# Patient Record
Sex: Male | Born: 1990 | Race: Black or African American | Hispanic: No | Marital: Single | Smoking: Former smoker
Health system: Southern US, Community
[De-identification: ages and names within clinical notes are randomized; demographics above are authoritative.]

---
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
## 2009-01-21 ENCOUNTER — Emergency Department (HOSPITAL_COMMUNITY): Admission: EM | Admit: 2009-01-21 | Discharge: 2009-01-21 | Payer: Self-pay | Admitting: Emergency Medicine

## 2009-02-19 ENCOUNTER — Emergency Department (HOSPITAL_COMMUNITY): Admission: EM | Admit: 2009-02-19 | Discharge: 2009-02-19 | Payer: Self-pay | Admitting: Emergency Medicine

## 2009-05-06 ENCOUNTER — Emergency Department (HOSPITAL_COMMUNITY): Admission: EM | Admit: 2009-05-06 | Discharge: 2009-05-06 | Payer: Self-pay | Admitting: Emergency Medicine

## 2009-07-25 ENCOUNTER — Emergency Department (HOSPITAL_COMMUNITY): Admission: EM | Admit: 2009-07-25 | Discharge: 2009-07-25 | Payer: Self-pay | Admitting: Emergency Medicine

## 2009-11-24 ENCOUNTER — Emergency Department (HOSPITAL_COMMUNITY): Admission: EM | Admit: 2009-11-24 | Discharge: 2009-11-24 | Payer: Self-pay | Admitting: Emergency Medicine

## 2009-11-29 ENCOUNTER — Emergency Department (HOSPITAL_COMMUNITY): Admission: EM | Admit: 2009-11-29 | Discharge: 2009-11-29 | Payer: Self-pay | Admitting: Emergency Medicine

## 2010-03-30 ENCOUNTER — Emergency Department (HOSPITAL_COMMUNITY)
Admission: EM | Admit: 2010-03-30 | Discharge: 2010-03-30 | Payer: Self-pay | Source: Home / Self Care | Admitting: Emergency Medicine

## 2010-04-03 LAB — GC/CHLAMYDIA PROBE AMP, GENITAL: Chlamydia, DNA Probe: NEGATIVE

## 2010-06-03 ENCOUNTER — Emergency Department (HOSPITAL_COMMUNITY)
Admission: EM | Admit: 2010-06-03 | Discharge: 2010-06-03 | Discharge status: Against Medical Advice | Payer: No Typology Code available for payment source | Attending: Emergency Medicine | Admitting: Emergency Medicine

## 2010-06-04 ENCOUNTER — Emergency Department (HOSPITAL_COMMUNITY)
Admission: EM | Admit: 2010-06-04 | Discharge: 2010-06-05 | Disposition: A | Discharge status: Home / Self Care | Payer: No Typology Code available for payment source | Attending: Emergency Medicine | Admitting: Emergency Medicine

## 2010-06-04 ENCOUNTER — Emergency Department (HOSPITAL_COMMUNITY): Payer: No Typology Code available for payment source

## 2010-06-04 DIAGNOSIS — M25476 Effusion, unspecified foot: Secondary | ICD-10-CM | POA: Insufficient documentation

## 2010-06-04 DIAGNOSIS — S93409A Sprain of unspecified ligament of unspecified ankle, initial encounter: Secondary | ICD-10-CM | POA: Insufficient documentation

## 2010-06-04 DIAGNOSIS — M25579 Pain in unspecified ankle and joints of unspecified foot: Secondary | ICD-10-CM | POA: Insufficient documentation

## 2010-06-04 DIAGNOSIS — M25473 Effusion, unspecified ankle: Secondary | ICD-10-CM | POA: Insufficient documentation

## 2010-06-12 LAB — RAPID STREP SCREEN (MED CTR MEBANE ONLY): Streptococcus, Group A Screen (Direct): NEGATIVE

## 2011-11-15 ENCOUNTER — Emergency Department (HOSPITAL_COMMUNITY)
Admission: EM | Admit: 2011-11-15 | Discharge: 2011-11-15 | Disposition: A | Discharge status: Home / Self Care | Payer: Self-pay | Attending: Emergency Medicine | Admitting: Emergency Medicine

## 2011-11-15 ENCOUNTER — Emergency Department (HOSPITAL_COMMUNITY): Payer: Self-pay

## 2011-11-15 ENCOUNTER — Encounter (HOSPITAL_COMMUNITY): Payer: Self-pay | Admitting: *Deleted

## 2011-11-15 DIAGNOSIS — M79609 Pain in unspecified limb: Secondary | ICD-10-CM | POA: Insufficient documentation

## 2011-11-15 DIAGNOSIS — M79643 Pain in unspecified hand: Secondary | ICD-10-CM

## 2011-11-15 DIAGNOSIS — Z87891 Personal history of nicotine dependence: Secondary | ICD-10-CM | POA: Insufficient documentation

## 2011-11-15 NOTE — ED Provider Notes (Signed)
History     CSN: [id number]  Arrival date & time 11/15/11  2101   First MD Initiated Contact with Patient 11/15/11 2234      Chief Complaint  Patient presents with  . Hand Pain    (Consider location/radiation/quality/duration/timing/severity/associated sxs/prior treatment) HPI Comments: Patient fractured.  The fourth metatarsal more than a year ago.  It heals, poorly.  He is having chronic pain at the site is worsened when he makes a this  Patient is a 21 y.o. male presenting with hand pain. The history is provided by the patient.  Hand Pain This is a chronic problem. The current episode started more than 1 year ago. The problem occurs constantly. Pertinent negatives include no fever, joint swelling, numbness or weakness.    History reviewed. No pertinent past medical history.  History reviewed. No pertinent past surgical history.  No family history on file.  History  Substance Use Topics  . Smoking status: Former Smoker    Types: Cigarettes  . Smokeless tobacco: Not on file  . Alcohol Use: 4.2 oz/week    7 Glasses of wine per week      Review of Systems  Constitutional: Negative for fever.  Musculoskeletal: Negative for joint swelling.  Skin: Negative for wound.  Neurological: Negative for weakness and numbness.    Allergies  Review of patient's allergies indicates no known allergies.  Home Medications  No current outpatient prescriptions on file.  BP 126/86  Pulse 73  Temp 98.7 F (37.1 C) (Oral)  Resp 20  SpO2 100%  Physical Exam  Constitutional: He appears well-nourished.  HENT:  Head: Normocephalic.  Neck: Normal range of motion.  Cardiovascular: Normal rate.   Pulmonary/Chest: Effort normal.  Musculoskeletal: Normal range of motion.       Arms: Neurological: He is alert.  Skin: Skin is warm.    ED Course  Procedures (including critical care time)  Labs Reviewed - No data to display Dg Hand Complete Right  11/15/2011  *RADIOLOGY REPORT*   Clinical Data: Right hand pain over the small and ring fingers. Fracture 1 year ago.  RIGHT HAND - COMPLETE 3+ VIEW  Comparison: 07/25/2009  Findings: Old fracture deformity of the fourth metacarpal corresponding to acute fracture on the previous study.  No evidence of acute fracture or subluxation.  No focal bone lesion or bone destruction.  No abnormal periosteal reaction.  Bone cortex and trabecular architecture appear intact.  No radiopaque soft tissue foreign bodies.  IMPRESSION: Old fracture deformity of the fourth metacarpal.  No acute fractures identified.   Original Report Authenticated By: Marlon Pel, M.D.      1. Hand pain       MDM   Old fracture.  That healed, poorly refer to hand surgery for second opinion        Arman Filter, NP 11/15/11 2320  Arman Filter, NP 11/15/11 2320

## 2011-11-15 NOTE — ED Provider Notes (Signed)
Medical screening examination/treatment/procedure(s) were performed by non-physician practitioner and as supervising physician I was immediately available for consultation/collaboration.  Bary Limbach L Hydia Copelin, MD 11/15/11 2345 

## 2011-11-15 NOTE — ED Notes (Signed)
Pt c/o metatarsal fx that was tx here in teh ED over a year ago.  However, 3 days ago the same location of the injury began to hurt and swell.  Swelling noted.  Pt denies recent injury,

## 2015-07-28 ENCOUNTER — Encounter (HOSPITAL_COMMUNITY): Payer: Self-pay | Admitting: Emergency Medicine

## 2015-07-28 ENCOUNTER — Emergency Department (HOSPITAL_COMMUNITY)
Admission: EM | Admit: 2015-07-28 | Discharge: 2015-07-28 | Disposition: A | Discharge status: Home / Self Care | Payer: No Typology Code available for payment source | Attending: Emergency Medicine | Admitting: Emergency Medicine

## 2015-07-28 DIAGNOSIS — H9201 Otalgia, right ear: Secondary | ICD-10-CM | POA: Insufficient documentation

## 2015-07-28 DIAGNOSIS — J029 Acute pharyngitis, unspecified: Secondary | ICD-10-CM

## 2015-07-28 DIAGNOSIS — Z87891 Personal history of nicotine dependence: Secondary | ICD-10-CM | POA: Insufficient documentation

## 2015-07-28 LAB — RAPID STREP SCREEN (MED CTR MEBANE ONLY): STREPTOCOCCUS, GROUP A SCREEN (DIRECT): NEGATIVE

## 2015-07-28 MED ORDER — CHLORHEXIDINE GLUCONATE 0.12 % MT SOLN: 15.0000 mL | Freq: Two times a day (BID) | OROMUCOSAL | Status: AC

## 2015-07-28 NOTE — ED Notes (Signed)
Pt. reports sore throat for several weeks with occasional dry cough , denies fever or chills. Respirations unlabored / no oral swelling.

## 2015-07-28 NOTE — Discharge Instructions (Signed)

## 2015-07-28 NOTE — ED Notes (Signed)
Patient able to ambulate independently  

## 2015-07-28 NOTE — ED Provider Notes (Signed)
CSN: [id number]     Arrival date & time 07/28/15  [id number] History  By signing my name below, I, [facility] Washington, attest that this documentation has been prepared under the direction and in the presence of Fayrene HelperBowie Amity Roes, PA-C. Electronically Signed: Randell PatientMarrissa Washington, ED Scribe. 07/28/2015. 8:23 PM.   Chief Complaint  Patient presents with  . Sore Throat   The history is provided by the patient. No language interpreter was used.   HPI Comments: Adrian Knox is a 25 y.o. male who presents to the Emergency Department complaining of constant, mild, right-sided sore throat onset 4 weeks ago. Pt states that the pain began on both sides of throat but has since progressed to only the right side. He reports congestion, rhinorrhea once 2 weeks ago, right ear pain, and cough productive of thick yellow-brown mucus last week. Symptoms are worsen when lying on his right side. He has taken cough drops, OTC medications, and used a humidifier with slight relief. He is an occasional cigarette smoker but only smoked once in the past 2 weeks. He drinks ETOH regularly. Denies HA, fever, CP, difficulty breathing.  History reviewed. No pertinent past medical history. History reviewed. No pertinent past surgical history. No family history on file. Social History  Substance Use Topics  . Smoking status: Former Smoker    Types: Cigarettes  . Smokeless tobacco: None  . Alcohol Use: Yes    Review of Systems  Constitutional: Negative for fever.  HENT: Positive for congestion, ear pain, rhinorrhea and sore throat.   Respiratory: Positive for cough.   Cardiovascular: Negative for chest pain.  Neurological: Negative for headaches.    Allergies  Review of patient's allergies indicates no known allergies.  Home Medications   Prior to Admission medications   Medication Sig Start Date End Date Taking? Authorizing Provider  chlorhexidine (PERIDEX) 0.12 % solution Use as directed 15 mLs in the mouth or throat 2  (two) times daily. 07/28/15   Fayrene HelperBowie Bevan Disney, PA-C   BP 135/56 mmHg  Pulse 71  Temp(Src) 99 F (37.2 C) (Oral)  Resp 18  Ht 5\' 11"  (1.803 m)  Wt 171 lb (77.565 kg)  BMI 23.86 kg/m2  SpO2 99% Physical Exam  Constitutional: He is oriented to person, place, and time. He appears well-developed and well-nourished. No distress.  HENT:  Head: Normocephalic and atraumatic.  Right Ear: Tympanic membrane, external ear and ear canal normal.  Left Ear: Tympanic membrane, external ear and ear canal normal.  Nose: Nose normal.  Mouth/Throat: Uvula is midline. Posterior oropharyngeal erythema present. No oropharyngeal exudate.  Ears normal. Nose normal. Throat and uvula midline. Mild posterior oropharynx erythema. No exudate.  Eyes: Conjunctivae are normal.  Neck: Normal range of motion.  No cervical lymphandeopathy.  Cardiovascular: Normal rate, regular rhythm and normal heart sounds.  Exam reveals no gallop and no friction rub.   No murmur heard. Heart is normal  Pulmonary/Chest: Effort normal and breath sounds normal. No respiratory distress. He has no wheezes. He has no rales.  Lungs CTA bilaterally.  Musculoskeletal: Normal range of motion.  Lymphadenopathy:    He has no cervical adenopathy.  Neurological: He is alert and oriented to person, place, and time.  Skin: Skin is warm and dry.  Psychiatric: He has a normal mood and affect. His behavior is normal.  Nursing note and vitals reviewed.   ED Course  Procedures   DIAGNOSTIC STUDIES: Oxygen Saturation is 99% on RA, normal by my interpretation.    COORDINATION OF  CARE: 8:09 PM Discussed results of rapid strep screen. Will discharge home with prescription for throat spray. Discussed treatment plan with pt at bedside and pt agreed to plan.  Labs Review Labs Reviewed  RAPID STREP SCREEN (NOT AT [facility])  CULTURE, GROUP A STREP [facility])   I have personally reviewed and evaluated these lab results as part of my medical  decision-making.    MDM   Final diagnoses:  Sore throat    BP 135/56 mmHg  Pulse 71  Temp(Src) 99 F (37.2 C) (Oral)  Resp 18  Ht  (1.803 m)  Wt 77.565 kg  BMI 23.86 kg/m2  SpO2 99%   I personally performed the services described in this documentation, which was scribed in my presence. The recorded information has been reviewed and is accurate.     Fayrene Helper, PA-C 07/28/15 2026  Donnetta Hutching, MD 07/28/15 [phone]

## 2015-07-31 LAB — CULTURE, GROUP A STREP (THRC)

## 2015-09-15 ENCOUNTER — Emergency Department (HOSPITAL_COMMUNITY)
Admission: EM | Admit: 2015-09-15 | Discharge: 2015-09-15 | Disposition: A | Discharge status: Home / Self Care | Payer: No Typology Code available for payment source | Attending: Emergency Medicine | Admitting: Emergency Medicine

## 2015-09-15 ENCOUNTER — Encounter (HOSPITAL_COMMUNITY): Payer: Self-pay | Admitting: Emergency Medicine

## 2015-09-15 DIAGNOSIS — K625 Hemorrhage of anus and rectum: Secondary | ICD-10-CM | POA: Insufficient documentation

## 2015-09-15 DIAGNOSIS — Z87891 Personal history of nicotine dependence: Secondary | ICD-10-CM | POA: Insufficient documentation

## 2015-09-15 LAB — URINALYSIS, ROUTINE W REFLEX MICROSCOPIC
Bilirubin Urine: NEGATIVE
Glucose, UA: NEGATIVE mg/dL
HGB URINE DIPSTICK: NEGATIVE
Ketones, ur: NEGATIVE mg/dL
Leukocytes, UA: NEGATIVE
NITRITE: NEGATIVE
PH: 7 (ref 5.0–8.0)
Protein, ur: NEGATIVE mg/dL
SPECIFIC GRAVITY, URINE: 1.03 (ref 1.005–1.030)

## 2015-09-15 LAB — COMPREHENSIVE METABOLIC PANEL
ALBUMIN: 4 g/dL (ref 3.5–5.0)
ALK PHOS: 56 U/L (ref 38–126)
ALT: 11 U/L — ABNORMAL LOW (ref 17–63)
ANION GAP: 6 (ref 5–15)
AST: 19 U/L (ref 15–41)
BUN: 14 mg/dL (ref 6–20)
CALCIUM: 9.5 mg/dL (ref 8.9–10.3)
CO2: 27 mmol/L (ref 22–32)
Chloride: 104 mmol/L (ref 101–111)
Creatinine, Ser: 1.35 mg/dL — ABNORMAL HIGH (ref 0.61–1.24)
GFR calc non Af Amer: >60 mL/min (ref >60)
GLUCOSE: 83 mg/dL (ref 65–99)
POTASSIUM: 3.9 mmol/L (ref 3.5–5.1)
SODIUM: 137 mmol/L (ref 135–145)
Total Bilirubin: 1 mg/dL (ref 0.3–1.2)
Total Protein: 6.8 g/dL (ref 6.5–8.1)

## 2015-09-15 LAB — LIPASE, BLOOD: LIPASE: 17 U/L (ref 11–51)

## 2015-09-15 LAB — CBC
HEMATOCRIT: 44.8 % (ref 39.0–52.0)
HEMOGLOBIN: 14.8 g/dL (ref 13.0–17.0)
MCH: 30 pg (ref 26.0–34.0)
MCHC: 33 g/dL (ref 30.0–36.0)
MCV: 90.7 fL (ref 78.0–100.0)
Platelets: 187 10*3/uL (ref 150–400)
RBC: 4.94 MIL/uL (ref 4.22–5.81)
RDW: 12.8 % (ref 11.5–15.5)
WBC: 5.3 10*3/uL (ref 4.0–10.5)

## 2015-09-15 LAB — POC OCCULT BLOOD, ED: FECAL OCCULT BLD: NEGATIVE

## 2015-09-15 MED ORDER — HYDROCORTISONE 2.5 % RE CREA: TOPICAL_CREAM | RECTAL | Status: AC

## 2015-09-15 NOTE — ED Notes (Signed)
Patient able to ambulate independently  

## 2015-09-15 NOTE — ED Provider Notes (Signed)
CSN: [id number]     Arrival date & time 09/15/15  1749 History   First MD Initiated Contact with Patient 09/15/15 2124     Chief Complaint  Patient presents with  . Abdominal Pain  . Rectal Bleeding     (Consider location/radiation/quality/duration/timing/severity/associated sxs/prior Treatment) Patient is a 25 y.o. male presenting with hematochezia. The history is provided by the patient.  Rectal Bleeding Quality:  Bright red Amount:  Scant Duration:  1 day Timing:  Intermittent Progression:  Resolved Chronicity:  New Context: diarrhea   Context: not anal fissures, not anal penetration, not constipation, not foreign body, not hemorrhoids, not rectal injury, not rectal pain and not spontaneously   Similar prior episodes: no   Relieved by:  None tried Worsened by:  Nothing tried Ineffective treatments:  None tried Associated symptoms: abdominal pain   Associated symptoms: no dizziness, no fever, no light-headedness and no vomiting     History reviewed. No pertinent past medical history. History reviewed. No pertinent past surgical history. No family history on file. Social History  Substance Use Topics  . Smoking status: Former Smoker    Types: Cigarettes  . Smokeless tobacco: None  . Alcohol Use: Yes    Review of Systems  Constitutional: Negative for fever and chills.  HENT: Negative for congestion and sore throat.   Eyes: Negative for pain.  Respiratory: Negative for cough and shortness of breath.   Cardiovascular: Negative for chest pain and palpitations.  Gastrointestinal: Positive for abdominal pain, diarrhea, hematochezia and anal bleeding. Negative for nausea, vomiting, constipation and abdominal distention.  Endocrine: Negative.   Genitourinary: Negative for dysuria, hematuria, flank pain, penile swelling and penile pain.  Musculoskeletal: Negative for back pain and neck pain.  Skin: Negative for rash.  Allergic/Immunologic: Negative.   Neurological: Negative  for dizziness, syncope and light-headedness.  Psychiatric/Behavioral: Negative for confusion.      Allergies  Review of patient's allergies indicates no known allergies.  Home Medications   Prior to Admission medications   Medication Sig Start Date End Date Taking? Authorizing Provider  chlorhexidine (PERIDEX) 0.12 % solution Use as directed 15 mLs in the mouth or throat 2 (two) times daily. 07/28/15   Fayrene HelperBowie Tran, PA-C   BP 123/67 mmHg  Pulse 81  Temp(Src) 98.6 F (37 C) (Oral)  Resp 18  SpO2 97% Physical Exam  Constitutional: He is oriented to person, place, and time. He appears well-developed and well-nourished.  HENT:  Head: Normocephalic and atraumatic.  Eyes: Conjunctivae and EOM are normal. Pupils are equal, round, and reactive to light.  Neck: Normal range of motion. Neck supple.  Cardiovascular: Normal rate, regular rhythm, normal heart sounds and intact distal pulses.   Pulmonary/Chest: Effort normal and breath sounds normal. No respiratory distress.  Abdominal: Soft. Bowel sounds are normal. There is no tenderness. There is no rebound, no CVA tenderness, no tenderness at McBurney's point and negative Murphy's sign. Hernia confirmed negative in the right inguinal area and confirmed negative in the left inguinal area.  Genitourinary: Rectum normal, testes normal and penis normal. Rectal exam shows no external hemorrhoid, no internal hemorrhoid, no fissure, no mass, no tenderness and anal tone normal. Guaiac negative stool. Cremasteric reflex is present. Circumcised.  Musculoskeletal: Normal range of motion.  Neurological: He is alert and oriented to person, place, and time. He has normal reflexes. No cranial nerve deficit.  Skin: Skin is warm and dry.    ED Course  Procedures (including critical care time) Labs Review Labs Reviewed  COMPREHENSIVE METABOLIC PANEL - Abnormal; Notable for the following:    Creatinine, Ser 1.35 (*)    ALT 11 (*)    All other components  within normal limits  URINALYSIS, ROUTINE W REFLEX MICROSCOPIC (NOT AT [facility]) - Abnormal; Notable for the following:    APPearance HAZY (*)    All other components within normal limits  LIPASE, BLOOD  CBC    Imaging Review No results found. I have personally reviewed and evaluated these images and lab results as part of my medical decision-making.   EKG Interpretation None      MDM   Final diagnoses:  None   The pt is a 25 yo male presenting to the ED for episode of finding blood in his underwear after two episodes of loose stools yesterday associated with mild abdominal pain.  Normal BM today with no continued diarrhea, n/v, fevers, or abdominal pain.  No dysuria, penile discharge or blood in urine.   Ddx hemorrhoids, UTI, nephrolithiasis, anal fissure.   UA negative for blood or infection with no hx of nephrolithiasis and no abdominal pain.  No hemorrhoids on exam or GU abnormalities.  Pt would like testing for STI but no profolactic abx at this time.  Labs ordered.  Hemoccult negative.  Doubt infectious diarrhea as sx have resolved. Unknown etiology at this time but doubt any acute infection or life threatening etiology.  Cr mildly elevated but doubt acute failure or RCC.   Rx for anusol cream for possible internal hemorrhoids and educated to f/u with PCP as needed.   Labs were viewed by myself  incorporated into medical decision making.  Discussed pertinent finding with patient or caregiver prior to discharge with no further questions.  Immediate return precautions given and understood.  Medical decision making supervised by my attending Dr. Freddi StarrYao  Adoni Greenough, MD PGY-3 Emergency Medicine   Tery SanfilippoMatthew Tsion Inghram, MD 09/16/15 [id number]  Richardean Canalavid H Yao, MD 09/17/15 [phone]

## 2015-09-15 NOTE — ED Notes (Signed)
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 My stomach was hurting really bad. I had two bowel movements, had to stop the truck and go to the bathroom. When i took a shower it looked like i had blood spots in my boxers.". Pt denies pain at this time. Pt in NAD. Denies n/v/d today.

## 2015-09-15 NOTE — ED Notes (Signed)
Pt provided with cup for sample

## 2015-09-17 LAB — RPR: RPR Ser Ql: NONREACTIVE

## 2015-09-18 LAB — GC/CHLAMYDIA PROBE AMP (~~LOC~~) NOT AT ARMC
CHLAMYDIA, DNA PROBE: NEGATIVE
Neisseria Gonorrhea: NEGATIVE

## 2015-09-19 LAB — HIV ANTIBODY (ROUTINE TESTING W REFLEX): HIV SCREEN 4TH GENERATION: NONREACTIVE

## 2016-04-03 ENCOUNTER — Emergency Department (HOSPITAL_COMMUNITY)
Admission: EM | Admit: 2016-04-03 | Discharge: 2016-04-03 | Disposition: A | Discharge status: Home / Self Care | Payer: Commercial Managed Care - PPO | Attending: Emergency Medicine | Admitting: Emergency Medicine

## 2016-04-03 ENCOUNTER — Encounter (HOSPITAL_COMMUNITY): Payer: Self-pay | Admitting: *Deleted

## 2016-04-03 DIAGNOSIS — Z87891 Personal history of nicotine dependence: Secondary | ICD-10-CM | POA: Diagnosis not present

## 2016-04-03 DIAGNOSIS — R509 Fever, unspecified: Secondary | ICD-10-CM | POA: Insufficient documentation

## 2016-04-03 DIAGNOSIS — R6889 Other general symptoms and signs: Secondary | ICD-10-CM

## 2016-04-03 NOTE — ED Provider Notes (Signed)
  [facility]-EMERGENCY DEPT Provider Note   CSN: [id number] Arrival date & time: 04/03/16  1238     History   Chief Complaint Chief Complaint  Patient presents with  . Influenza    HPI Adrian Knox is a 26 y.o. male.  HPI   26 y.o male with cough, congestion, subjective fever,  Myalgias for 2 days.  NO headache, dyspnea, orthostatic symptoms.  No flud shot this year.  Sick contacts.   History reviewed. No pertinent past medical history.  There are no active problems to display for this patient.   History reviewed. No pertinent surgical history.     Home Medications    Prior to Admission medications   Medication Sig Start Date End Date Taking? Authorizing Provider  chlorhexidine (PERIDEX) 0.12 % solution Use as directed 15 mLs in the mouth or throat 2 (two) times daily. 07/28/15   Fayrene HelperBowie Tran, PA-C  hydrocortisone (ANUSOL-HC) 2.5 % rectal cream Apply rectally 2 times daily 09/15/15   Tery SanfilippoMatthew Riester, MD    Family History History reviewed. No pertinent family history.  Social History Social History  Substance Use Topics  . Smoking status: Former Smoker    Types: Cigarettes  . Smokeless tobacco: Not on file  . Alcohol use Yes     Allergies   Patient has no known allergies.   Review of Systems Review of Systems  All other systems reviewed and are negative.    Physical Exam Updated Vital Signs BP 132/69 (BP Location: Right Arm)   Pulse 88   Temp 98.3 F (36.8 C) (Oral)   Resp 18   SpO2 96%   Physical Exam  Constitutional: He is oriented to person, place, and time. He appears well-developed and well-nourished.  HENT:  Head: Normocephalic and atraumatic.  Right Ear: External ear normal.  Left Ear: External ear normal.  Nose: Nose normal.  Mouth/Throat: Oropharynx is clear and moist.  Eyes: Conjunctivae and EOM are normal. Pupils are equal, round, and reactive to light.  Neck: Normal range of motion. Neck supple.  Cardiovascular: Normal rate,  regular rhythm, normal heart sounds and intact distal pulses.   Pulmonary/Chest: Effort normal and breath sounds normal.  Abdominal: Soft. Bowel sounds are normal.  Musculoskeletal: Normal range of motion.  Neurological: He is alert and oriented to person, place, and time. He has normal reflexes.  Skin: Skin is warm and dry.  Psychiatric: He has a normal mood and affect. His behavior is normal. Judgment and thought content normal.  Nursing note and vitals reviewed.    ED Treatments / Results  Labs (all labs ordered are listed, but only abnormal results are displayed) Labs Reviewed - No data to display  EKG  EKG Interpretation None       Radiology No results found.  Procedures Procedures (including critical care time)  Medications Ordered in ED Medications - No data to display   Initial Impression / Assessment and Plan / ED Course  I have reviewed the triage vital signs and the nursing notes.  Pertinent labs & imaging results that were available during my care of the patient were reviewed by me and considered in my medical decision making (see chart for details).     Final Clinical Impressions(s) / ED Diagnoses   Final diagnoses:  Flu-like symptoms    New Prescriptions New Prescriptions   No medications on file     Margarita Grizzleanielle Vanecia Limpert, MD 04/03/16 1317

## 2016-04-03 NOTE — ED Triage Notes (Addendum)
Pt reports bodyaches, headache, back pain, cough, fever x 2 days. Mask on pt at triage.

## 2019-08-30 ENCOUNTER — Emergency Department (HOSPITAL_COMMUNITY)
Admission: EM | Admit: 2019-08-30 | Discharge: 2019-08-31 | Disposition: A | Discharge status: Home / Self Care | Payer: Self-pay | Attending: Emergency Medicine | Admitting: Emergency Medicine

## 2019-08-30 ENCOUNTER — Encounter (HOSPITAL_COMMUNITY): Payer: Self-pay | Admitting: Emergency Medicine

## 2019-08-30 ENCOUNTER — Other Ambulatory Visit: Payer: Self-pay

## 2019-08-30 ENCOUNTER — Emergency Department (HOSPITAL_COMMUNITY): Payer: Self-pay

## 2019-08-30 DIAGNOSIS — Y9389 Activity, other specified: Secondary | ICD-10-CM | POA: Insufficient documentation

## 2019-08-30 DIAGNOSIS — S93504A Unspecified sprain of right lesser toe(s), initial encounter: Secondary | ICD-10-CM | POA: Insufficient documentation

## 2019-08-30 DIAGNOSIS — S93509A Unspecified sprain of unspecified toe(s), initial encounter: Secondary | ICD-10-CM

## 2019-08-30 DIAGNOSIS — Z87891 Personal history of nicotine dependence: Secondary | ICD-10-CM | POA: Insufficient documentation

## 2019-08-30 DIAGNOSIS — Y929 Unspecified place or not applicable: Secondary | ICD-10-CM | POA: Insufficient documentation

## 2019-08-30 DIAGNOSIS — W228XXA Striking against or struck by other objects, initial encounter: Secondary | ICD-10-CM | POA: Insufficient documentation

## 2019-08-30 DIAGNOSIS — Y999 Unspecified external cause status: Secondary | ICD-10-CM | POA: Insufficient documentation

## 2019-08-30 NOTE — Discharge Instructions (Addendum)
The x-ray shows no fracture.  This is likely a bad sprain.  Use ice and elevation to help with the symptoms.  Take Tylenol and Ibuprofen as directed on the bottle for pain.  As soon as the toe stops hurting, you can stop using the post-op shoe.  If not better in 2 weeks, see your doctor.

## 2019-08-30 NOTE — Progress Notes (Signed)
Orthopedic Tech Progress Note Patient Details:  Adrian Knox [birth year] [id number]  Ortho Devices Type of Ortho Device: Postop shoe/boot, Crutches Ortho Device/Splint Location: rle Ortho Device/Splint Interventions: Ordered, Application, Adjustment   Post Interventions Patient Tolerated: Well Instructions Provided: Care of device, Adjustment of device   Trinna Post 08/30/2019, 11:07 PM

## 2019-08-30 NOTE — ED Provider Notes (Signed)
[facility] EMERGENCY DEPARTMENT Provider Note   CSN: [id number] Arrival date & time: 08/30/19  2048     History Chief Complaint  Patient presents with  . Foot Pain    Adrian Knox is a 29 y.o. male.  Patient presents to the ED with a chief complaint of toe pain.  He states that he stubbed his right 4th toe while trying to get away from some wasps earlier today.  He states that he wants to ensure it isn't broken.  He denies any other symptoms.  It is worsened with movement and palpation.    The history is provided by the patient. No language interpreter was used.       History reviewed. No pertinent past medical history.  There are no problems to display for this patient.   History reviewed. No pertinent surgical history.     No family history on file.  Social History   Tobacco Use  . Smoking status: Former Smoker    Types: Cigarettes  Substance Use Topics  . Alcohol use: Yes  . Drug use: No    Home Medications Prior to Admission medications   Medication Sig Start Date End Date Taking? Authorizing Provider  chlorhexidine (PERIDEX) 0.12 % solution Use as directed 15 mLs in the mouth or throat 2 (two) times daily. 07/28/15   Fayrene Helper, PA-C  hydrocortisone (ANUSOL-HC) 2.5 % rectal cream Apply rectally 2 times daily 09/15/15   Tery Sanfilippo, MD    Allergies    Patient has no known allergies.  Review of Systems   Review of Systems  Constitutional: Negative for chills and fever.  Musculoskeletal: Positive for joint swelling.  Skin: Positive for color change.  Neurological: Negative for weakness and numbness.    Physical Exam Updated Vital Signs BP 136/76 (BP Location: Right Arm)   Pulse 87   Temp 98.8 F (37.1 C) (Oral)   Resp 16   Ht 5\' 11"  (1.803 m)   Wt 72.6 kg   SpO2 98%   BMI 22.32 kg/m   Physical Exam Nursing note and vitals reviewed.  Constitutional: Pt appears well-developed and well-nourished. No distress.    HENT:  Head: Normocephalic and atraumatic.  Eyes: Conjunctivae are normal.  Neck: Normal range of motion.  Cardiovascular: Normal rate, regular rhythm. Intact distal pulses.   Capillary refill < 3 sec.  Pulmonary/Chest: Effort normal and breath sounds normal.  Musculoskeletal:  Right 4th toe swelling Pt exhibits TTP and contusion.   ROM: limited by pain  Strength: limited by pain  Neurological: Pt  is alert. Coordination normal.  Sensation: 5/5 Skin: Skin is warm and dry. Pt is not diaphoretic.  No evidence of open wound or skin tenting Psychiatric: Pt has a normal mood and affect.    ED Results / Procedures / Treatments   Labs (all labs ordered are listed, but only abnormal results are displayed) Labs Reviewed - No data to display  EKG None  Radiology DG Foot Complete Right  Result Date: 08/30/2019 CLINICAL DATA:  Pain, swelling, third phalanx injury EXAM: RIGHT FOOT COMPLETE - 3+ VIEW COMPARISON:  None. FINDINGS: Alignment is anatomic. No acute fracture. Joint spaces are preserved. IMPRESSION: No acute fracture. Electronically Signed   By: 09/01/2019 M.D.   On: 08/30/2019 21:41    Procedures Procedures (including critical care time)  Medications Ordered in ED Medications - No data to display  ED Course  I have reviewed the triage vital signs and the nursing notes.  Pertinent labs & imaging results that were available during my care of the patient were reviewed by me and considered in my medical decision making (see chart for details).    MDM Rules/Calculators/A&P                          Patient presents with injury to left 4th toe.  DDx includes, fracture, strain, or sprain.  Consultants: none  Plain films reveal no fracture or dislocation.  Likely sprained based on exam findings.  Pt advised to follow up with PCP and/or orthopedics. Patient given post-op shoe and crutches while in ED, conservative therapy such as RICE recommended and discussed.    Patient will be discharged home & is agreeable with above plan. Returns precautions discussed. Pt appears safe for discharge.  Final Clinical Impression(s) / ED Diagnoses Final diagnoses:  Sprain of toe, initial encounter    Rx / DC Orders ED Discharge Orders    None       Delaine Lame 08/30/19 2259    Davonna Belling, MD 08/30/19 2310

## 2019-08-30 NOTE — ED Triage Notes (Signed)
Pt injured foot as he came of the house, pain continues to increase as well as swelling

## 2020-11-25 IMAGING — DX DG FOOT COMPLETE 3+V*R*
3 series · 3 of 3 positions shown · non-contrast
Comparison: None.

CLINICAL DATA: Pain, swelling, third phalanx injury

EXAM:
RIGHT FOOT COMPLETE - 3+ VIEW

[foot ap]
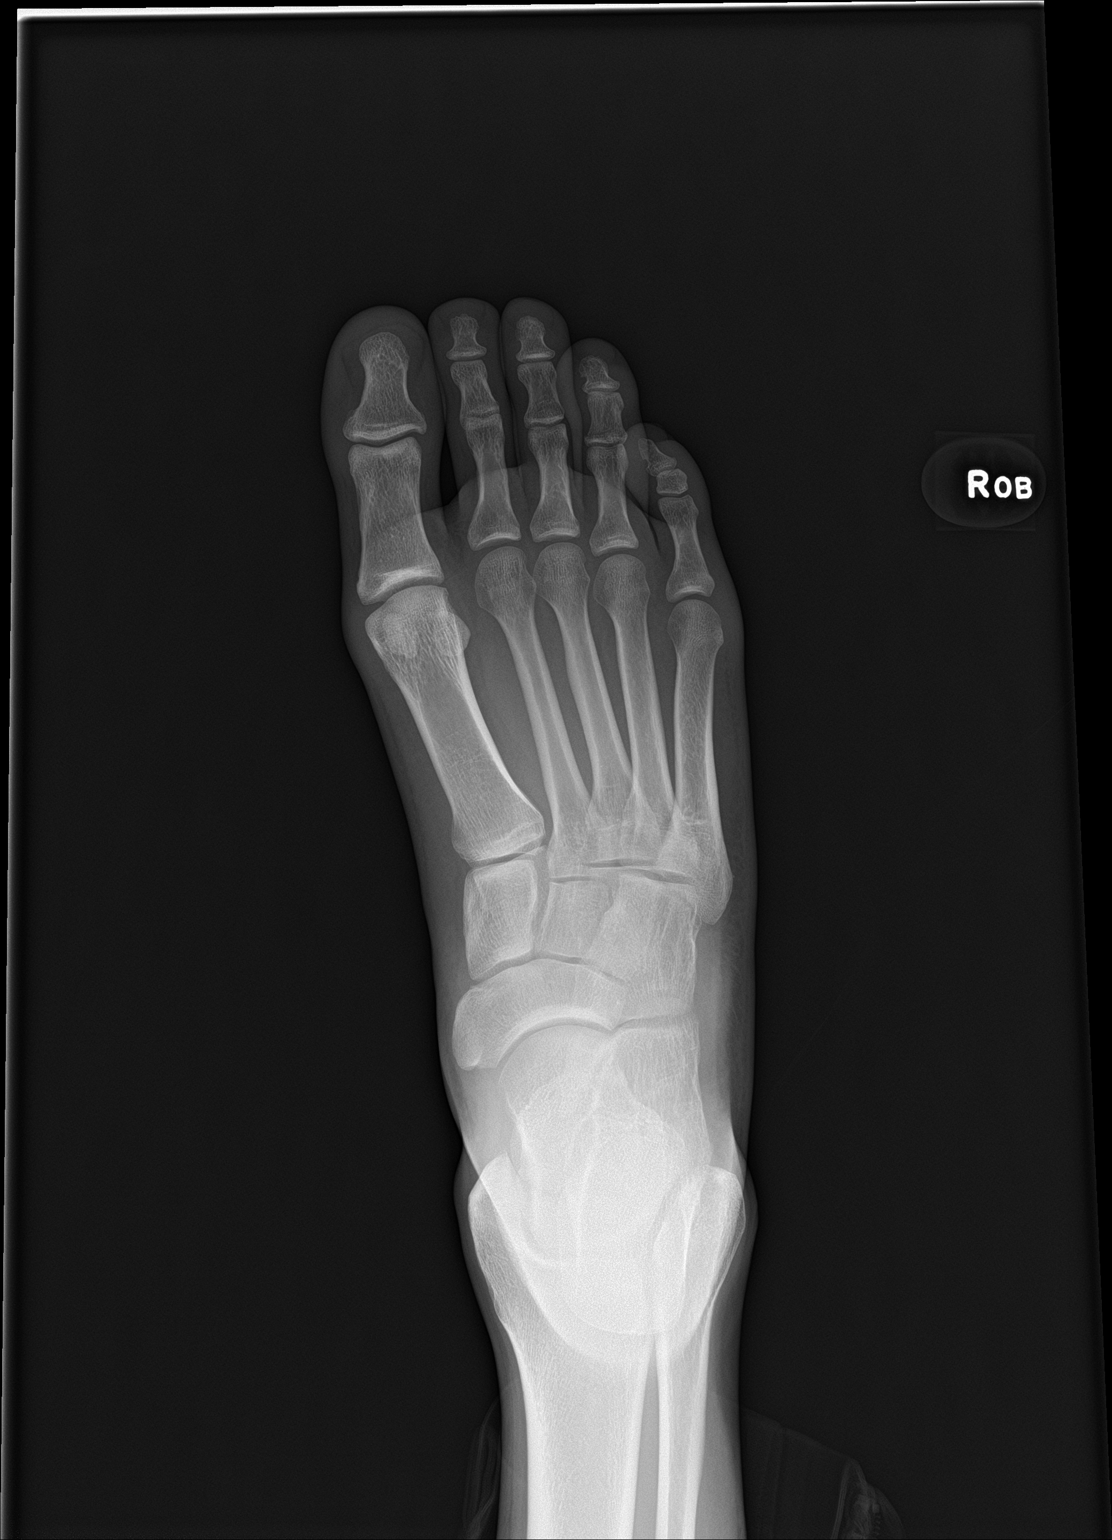

[foot obl]
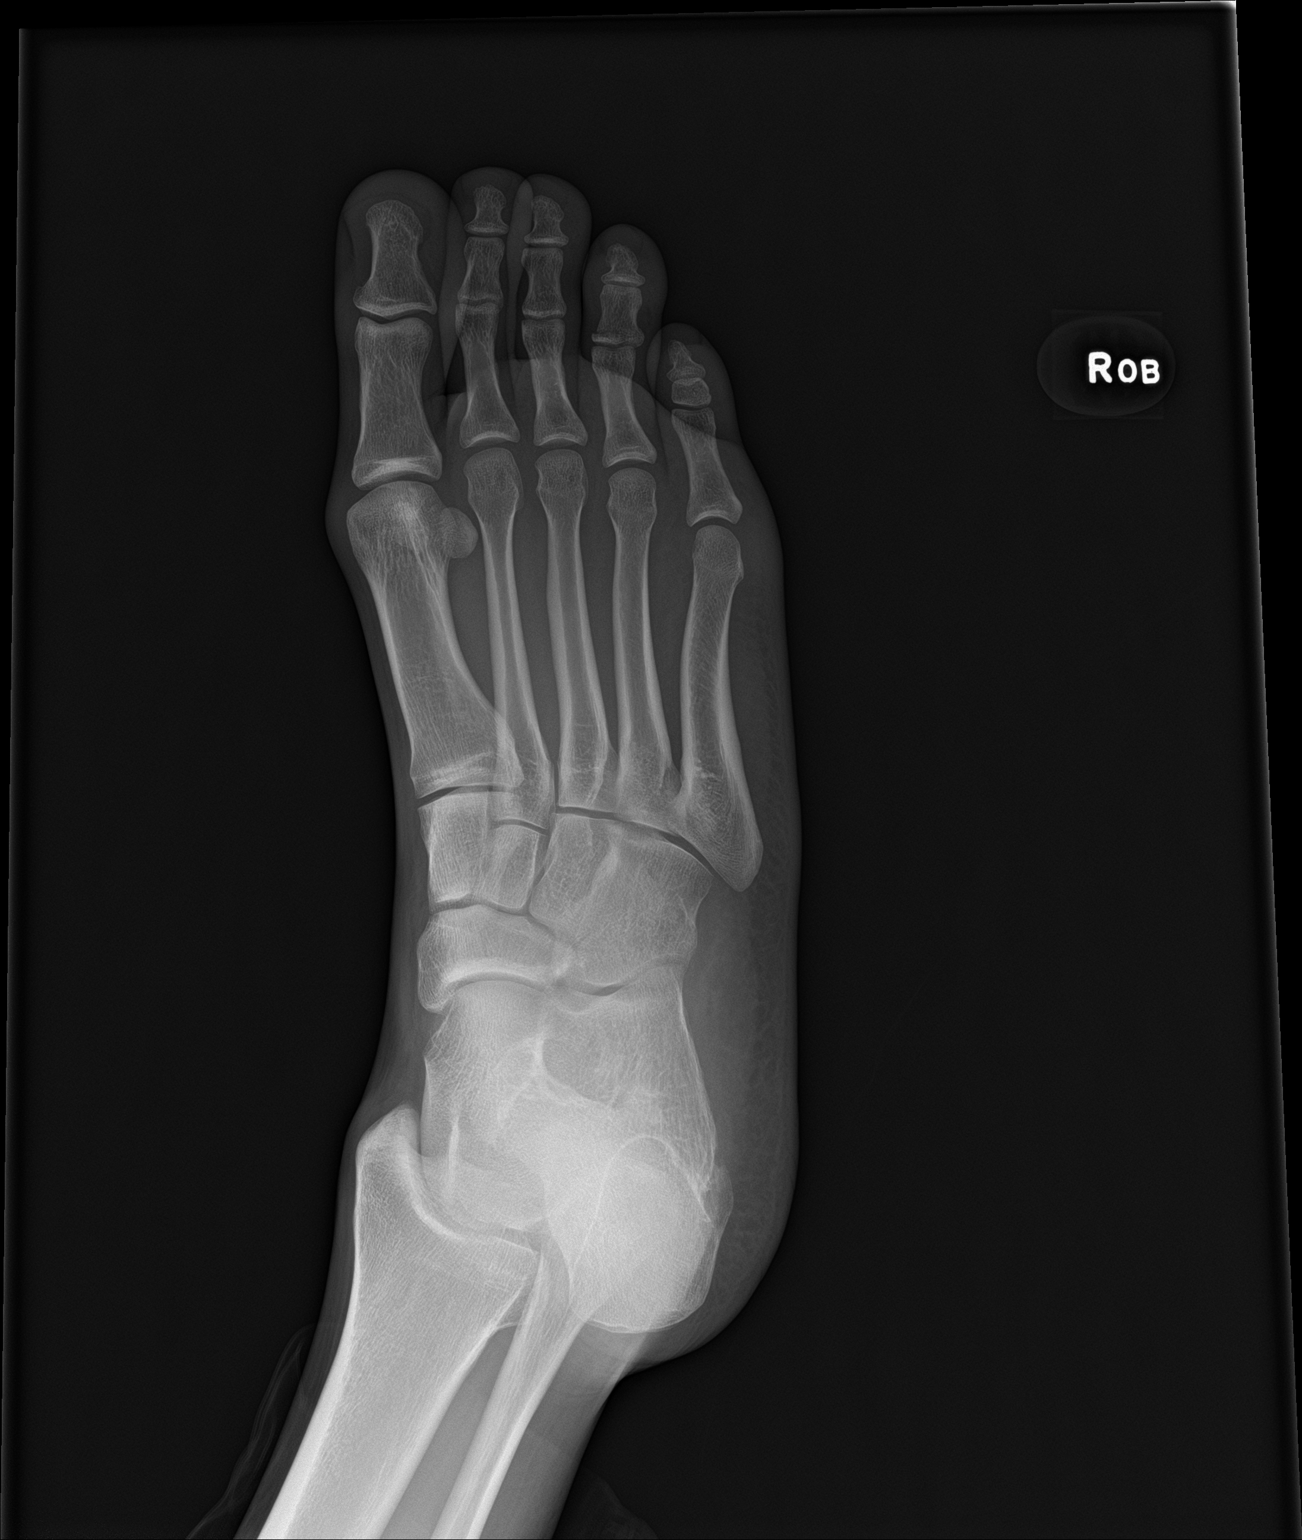

[foot lat]
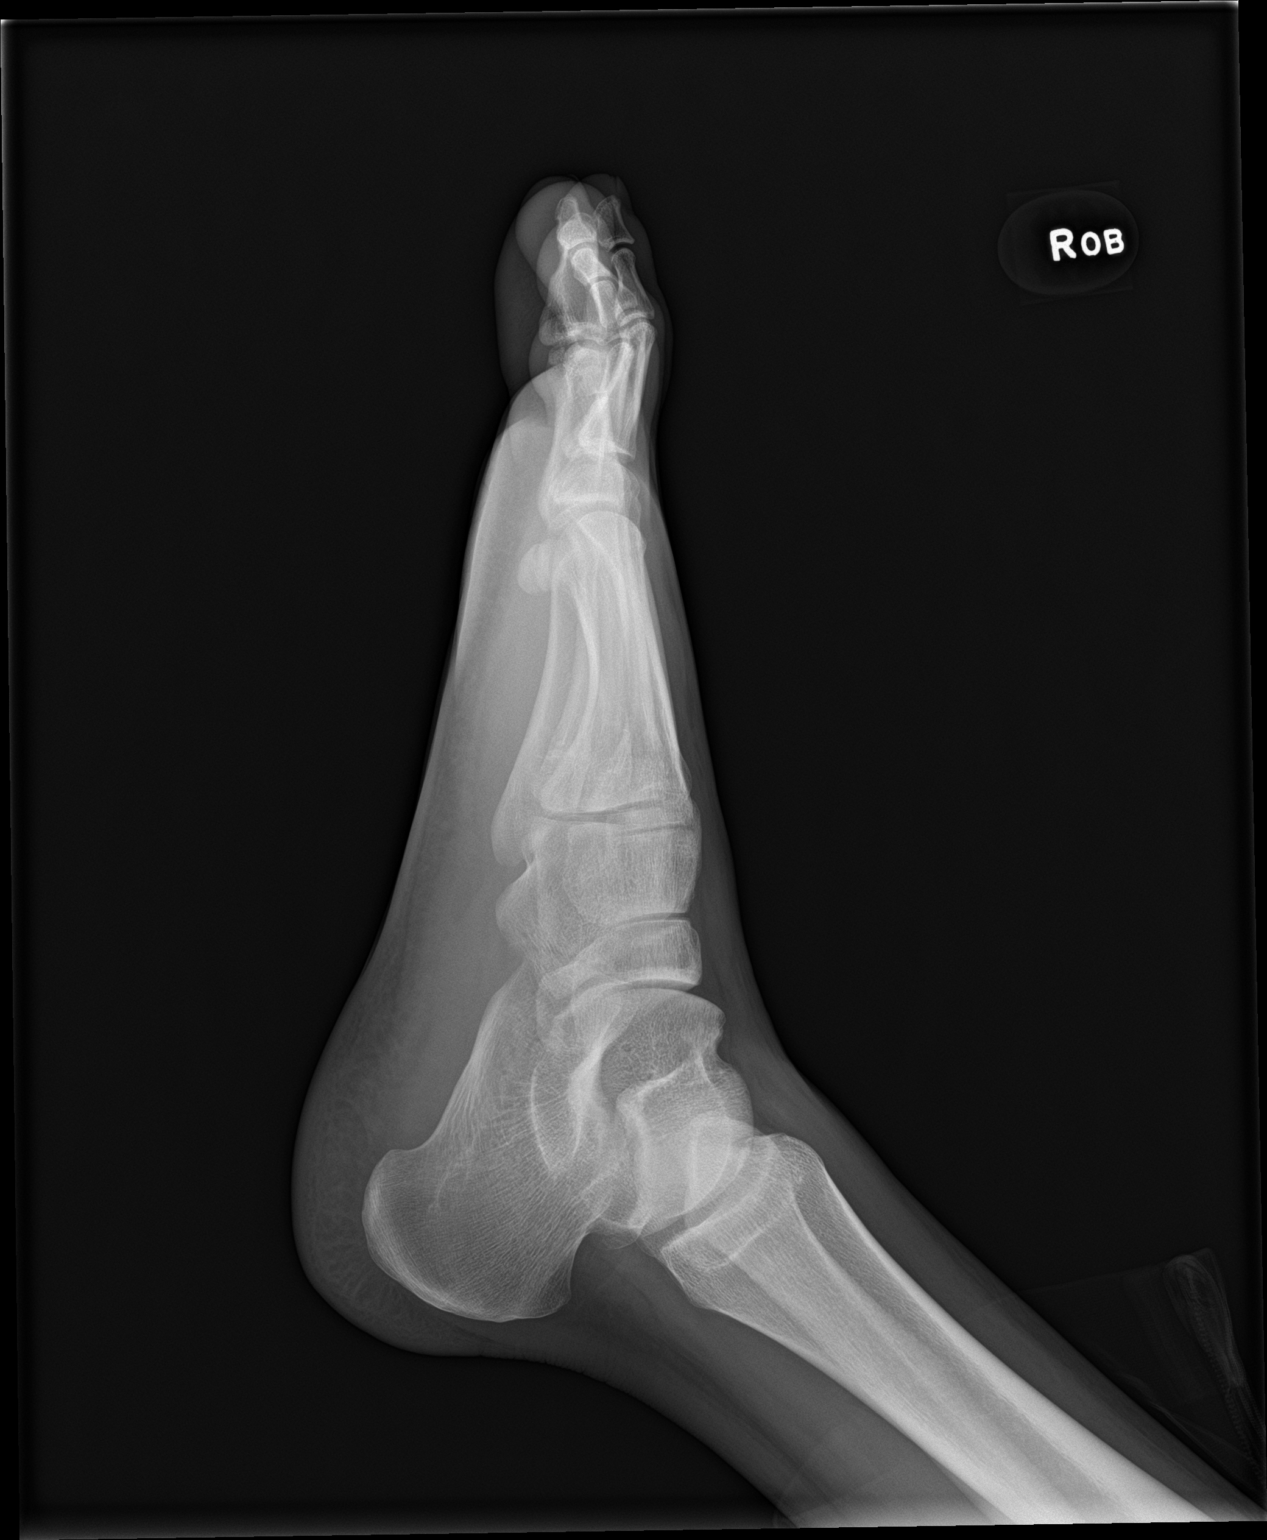

[3 of 3 positions shown; findings below may reference images not displayed]

FINDINGS: Alignment is anatomic. No acute fracture. Joint spaces are
preserved.
IMPRESSION: No acute fracture.

## 2022-01-02 ENCOUNTER — Other Ambulatory Visit: Payer: Self-pay

## 2022-01-02 ENCOUNTER — Encounter (HOSPITAL_BASED_OUTPATIENT_CLINIC_OR_DEPARTMENT_OTHER): Payer: Self-pay | Admitting: Urology

## 2022-01-02 ENCOUNTER — Emergency Department (HOSPITAL_BASED_OUTPATIENT_CLINIC_OR_DEPARTMENT_OTHER)
Admission: EM | Admit: 2022-01-02 | Discharge: 2022-01-02 | Disposition: A | Discharge status: Home / Self Care | Payer: Self-pay | Attending: Emergency Medicine | Admitting: Emergency Medicine

## 2022-01-02 ENCOUNTER — Emergency Department (HOSPITAL_BASED_OUTPATIENT_CLINIC_OR_DEPARTMENT_OTHER): Payer: Self-pay

## 2022-01-02 DIAGNOSIS — Z1152 Encounter for screening for COVID-19: Secondary | ICD-10-CM | POA: Insufficient documentation

## 2022-01-02 DIAGNOSIS — J181 Lobar pneumonia, unspecified organism: Secondary | ICD-10-CM | POA: Insufficient documentation

## 2022-01-02 DIAGNOSIS — J189 Pneumonia, unspecified organism: Secondary | ICD-10-CM

## 2022-01-02 LAB — BASIC METABOLIC PANEL
Anion gap: 8 (ref 5–15)
BUN: 10 mg/dL (ref 6–20)
CO2: 28 mmol/L (ref 22–32)
Calcium: 8.7 mg/dL — ABNORMAL LOW (ref 8.9–10.3)
Chloride: 99 mmol/L (ref 98–111)
Creatinine, Ser: 1.23 mg/dL (ref 0.61–1.24)
GFR, Estimated: >60 mL/min (ref >60)
Glucose, Bld: 94 mg/dL (ref 70–99)
Potassium: 3.6 mmol/L (ref 3.5–5.1)
Sodium: 135 mmol/L (ref 135–145)

## 2022-01-02 LAB — CBC
HCT: 45.2 % (ref 39.0–52.0)
Hemoglobin: 15.1 g/dL (ref 13.0–17.0)
MCH: 29.5 pg (ref 26.0–34.0)
MCHC: 33.4 g/dL (ref 30.0–36.0)
MCV: 88.3 fL (ref 80.0–100.0)
Platelets: 180 10*3/uL (ref 150–400)
RBC: 5.12 MIL/uL (ref 4.22–5.81)
RDW: 12.4 % (ref 11.5–15.5)
WBC: 8.5 10*3/uL (ref 4.0–10.5)
nRBC: 0 % (ref 0.0–0.2)

## 2022-01-02 LAB — TROPONIN I (HIGH SENSITIVITY): Troponin I (High Sensitivity): 3 ng/L (ref <18)

## 2022-01-02 LAB — RESP PANEL BY RT-PCR (FLU A&B, COVID) ARPGX2
Influenza A by PCR: NEGATIVE
Influenza B by PCR: NEGATIVE
SARS Coronavirus 2 by RT PCR: NEGATIVE

## 2022-01-02 MED ORDER — ALBUTEROL SULFATE HFA 108 (90 BASE) MCG/ACT IN AERS
2.0000 | INHALATION_SPRAY | Freq: Once | RESPIRATORY_TRACT | Status: AC
  Administered 2022-01-02: 2 via RESPIRATORY_TRACT
  Filled 2022-01-02: qty 6.7

## 2022-01-02 MED ORDER — AZITHROMYCIN 250 MG PO TABS
500.0000 mg | ORAL_TABLET | Freq: Once | ORAL | Status: AC
  Administered 2022-01-02: 500 mg via ORAL
  Filled 2022-01-02: qty 2

## 2022-01-02 MED ORDER — AZITHROMYCIN 250 MG PO TABS: 250.0000 mg | ORAL_TABLET | Freq: Every day | ORAL | 0 refills | Status: AC

## 2022-01-02 MED ORDER — ACETAMINOPHEN 325 MG PO TABS
650.0000 mg | ORAL_TABLET | Freq: Once | ORAL | Status: AC | PRN
  Administered 2022-01-02: 650 mg via ORAL
  Filled 2022-01-02: qty 2

## 2022-01-02 NOTE — ED Triage Notes (Signed)
Pt states sharp chest pain "towards my heart and into my neck"  states felt it on Sunday and it went away after deep breath, states chest tightness and SOB that started yesterday  Reports productive cough, chills last night, and fever today

## 2022-01-02 NOTE — Discharge Instructions (Addendum)
You were seen in the emergency department for cough fever shortness of breath chest pain.  You have a left-sided pneumonia.  You were given a dose of antibiotics.  Please continue the antibiotics and you can use the inhaler 2 puffs every 4-6 hours as needed.  Tylenol for fever.  Return to the emergency department if any worsening or concerning symptoms.  Please also consider stopping tobacco.  Your COVID and flu test were negative.

## 2022-01-02 NOTE — ED Provider Notes (Signed)
[facility] EMERGENCY DEPARTMENT Provider Note   CSN: [id number] Arrival date & time: 01/02/22  2035     History {Add pertinent medical, surgical, social history, OB history to HPI:1} Chief Complaint  Patient presents with   Chest Pain    Adrian Knox is a 31 y.o. male.  He has no significant past medical history.  He said 2 days ago when he took a deep breath he felt a sharp pain in the left side of his chest.  Starting yesterday he has had a cough productive of some yellow sputum along with fevers and chills.  Continue to have that left sharp pain usually when he lies on his left side.  No nausea or vomiting.  His daughter sick with a cold and she is in daycare.  He is a sometimes smoker.  The history is provided by the patient.  Chest Pain Pain location:  L chest Pain quality: sharp and stabbing   Pain radiates to:  Neck Pain severity:  Moderate Onset quality:  Sudden Duration:  3 days Timing:  Sporadic Progression:  Unchanged Chronicity:  New Context: breathing   Relieved by:  None tried Worsened by:  Certain positions and deep breathing Ineffective treatments:  None tried Associated symptoms: cough, fever and shortness of breath   Associated symptoms: no abdominal pain, no nausea and no vomiting   Risk factors: smoking        Home Medications Prior to Admission medications   Medication Sig Start Date End Date Taking? Authorizing Provider  chlorhexidine (PERIDEX) 0.12 % solution Use as directed 15 mLs in the mouth or throat 2 (two) times daily. 07/28/15   Fayrene Helper, PA-C  hydrocortisone (ANUSOL-HC) 2.5 % rectal cream Apply rectally 2 times daily 09/15/15   Tery Sanfilippo, MD      Allergies    Patient has no known allergies.    Review of Systems   Review of Systems  Constitutional:  Positive for fever.  HENT:  Negative for sore throat.   Eyes:  Negative for visual disturbance.  Respiratory:  Positive for cough and shortness of breath.    Cardiovascular:  Positive for chest pain.  Gastrointestinal:  Negative for abdominal pain, nausea and vomiting.  Skin:  Negative for rash.    Physical Exam Updated Vital Signs BP (!) 151/91 (BP Location: Left Arm)   Pulse 93   Temp (!) 101 F (38.3 C) (Oral)   Resp 20   Ht 5\' 11"  (1.803 m)   Wt 72.6 kg   SpO2 98%   BMI 22.32 kg/m  Physical Exam Vitals and nursing note reviewed.  Constitutional:      General: He is not in acute distress.    Appearance: He is well-developed.  HENT:     Head: Normocephalic and atraumatic.  Eyes:     Conjunctiva/sclera: Conjunctivae normal.  Cardiovascular:     Rate and Rhythm: Normal rate and regular rhythm.     Heart sounds: Normal heart sounds. No murmur heard. Pulmonary:     Effort: Pulmonary effort is normal. No respiratory distress.     Breath sounds: Examination of the left-middle field reveals rhonchi. Examination of the left-lower field reveals rhonchi. Rhonchi present.  Abdominal:     Palpations: Abdomen is soft.     Tenderness: There is no abdominal tenderness.  Musculoskeletal:        General: No swelling. Normal range of motion.     Cervical back: Neck supple.     Right lower  leg: No tenderness. No edema.     Left lower leg: No tenderness. No edema.  Skin:    General: Skin is warm and dry.     Capillary Refill: Capillary refill takes less than 2 seconds.  Neurological:     General: No focal deficit present.     Mental Status: He is alert.     ED Results / Procedures / Treatments   Labs (all labs ordered are listed, but only abnormal results are displayed) Labs Reviewed  BASIC METABOLIC PANEL - Abnormal; Notable for the following components:      Result Value   Calcium 8.7 (*)    All other components within normal limits  RESP PANEL BY RT-PCR (FLU A&B, COVID) ARPGX2  CBC  TROPONIN I (HIGH SENSITIVITY)    EKG EKG Interpretation  Date/Time:  Wednesday January 02 2022 20:47:56 EDT Ventricular Rate:  94 PR  Interval:  160 QRS Duration: 82 QT Interval:  334 QTC Calculation: 417 R Axis:   85 Text Interpretation: Normal sinus rhythm Right atrial enlargement Borderline ECG No previous ECGs available Confirmed by Meridee Score [phone]) on 01/02/2022 8:56:20 PM  Radiology DG Chest 2 View  Result Date: 01/02/2022 CLINICAL DATA:  Chest pain and cough. EXAM: CHEST - 2 VIEW COMPARISON:  None Available. FINDINGS: The heart size and mediastinal contours are within normal limits. Focal airspace disease is noted in the lingular segment of the left upper lobe. No effusion or pneumothorax. No acute osseous abnormality. IMPRESSION: Focal airspace disease in the lingular segment of the left upper lobe, concerning for developing pneumonia. Follow-up is recommended until resolution. Electronically Signed   By: Thornell Sartorius M.D.   On: 01/02/2022 21:08    Procedures Procedures  {Document cardiac monitor, telemetry assessment procedure when appropriate:1}  Medications Ordered in ED Medications  albuterol (VENTOLIN HFA) 108 (90 Base) MCG/ACT inhaler 2 puff (has no administration in time range)  azithromycin (ZITHROMAX) tablet 500 mg (has no administration in time range)  acetaminophen (TYLENOL) tablet 650 mg (650 mg Oral Given 01/02/22 2049)    ED Course/ Medical Decision Making/ A&P Clinical Course as of 01/02/22 2154  Wed Jan 02, 2022  2145 EKG not crossing in epic.  Normal sinus rhythm rate of 94 no acute ST-T changes. [MB]    Clinical Course User Index [MB] Terrilee Files, MD                           Medical Decision Making Amount and/or Complexity of Data Reviewed Labs: ordered. Radiology: ordered.  Risk OTC drugs. Prescription drug management.  Adrian Knox was evaluated in Emergency Department on 01/02/2022 for the symptoms described in the history of present illness. He was evaluated in the context of the global COVID-19 pandemic, which necessitated consideration that the patient  might be at risk for infection with the SARS-CoV-2 virus that causes COVID-19. Institutional protocols and algorithms that pertain to the evaluation of patients at risk for COVID-19 are in a state of rapid change based on information released by regulatory bodies including the CDC and federal and state organizations. These policies and algorithms were followed during the patient's care in the ED. This patient complains of ***; this involves an extensive number of treatment Options and is a complaint that carries with it a high risk of complications and morbidity. The differential includes ***  I ordered, reviewed and interpreted labs, which included *** I ordered medication *** and reviewed PMP  when indicated. I ordered imaging studies which included *** and I independently    visualized and interpreted imaging which showed *** Additional history obtained from *** Previous records obtained and reviewed *** I consulted *** and discussed lab and imaging findings and discussed disposition.  Cardiac monitoring reviewed, *** Social determinants considered, *** Critical Interventions: ***  After the interventions stated above, I reevaluated the patient and found *** Admission and further testing considered, ***    {Document critical care time when appropriate:1} {Document review of labs and clinical decision tools ie heart score, Chads2Vasc2 etc:1}  {Document your independent review of radiology images, and any outside records:1} {Document your discussion with family members, caretakers, and with consultants:1} {Document social determinants of health affecting pt's care:1} {Document your decision making why or why not admission, treatments were needed:1} Final Clinical Impression(s) / ED Diagnoses Final diagnoses:  None    Rx / DC Orders ED Discharge Orders     None
